# Patient Record
Sex: Female | Born: 1995 | Race: Black or African American | Hispanic: No | Marital: Single | Smoking: Never smoker
Health system: Southern US, Community
[De-identification: ages and names within clinical notes are randomized; demographics above are authoritative.]

---
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
## 2017-05-29 ENCOUNTER — Encounter (HOSPITAL_COMMUNITY): Payer: Self-pay | Admitting: Emergency Medicine

## 2017-05-29 ENCOUNTER — Emergency Department (HOSPITAL_COMMUNITY)
Admission: EM | Admit: 2017-05-29 | Discharge: 2017-05-29 | Disposition: A | Discharge status: Home / Self Care | Payer: No Typology Code available for payment source | Attending: Emergency Medicine | Admitting: Emergency Medicine

## 2017-05-29 DIAGNOSIS — M546 Pain in thoracic spine: Secondary | ICD-10-CM | POA: Insufficient documentation

## 2017-05-29 MED ORDER — METHOCARBAMOL 500 MG PO TABS: 500.0000 mg | ORAL_TABLET | Freq: Two times a day (BID) | ORAL | 0 refills | Status: AC

## 2017-05-29 NOTE — ED Notes (Signed)
Pt statesa she had intermittent pain at arms but none now. Denies any complaint or discomfort.
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
## 2017-05-29 NOTE — ED Notes (Addendum)
Pt states she understands instructions. Home stable with steady gait with family. 
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
## 2020-09-14 ENCOUNTER — Emergency Department (HOSPITAL_COMMUNITY)
Admission: EM | Admit: 2020-09-14 | Discharge: 2020-09-14 | Disposition: A | Discharge status: Home / Self Care | Payer: BC Managed Care – PPO | Attending: Emergency Medicine | Admitting: Emergency Medicine

## 2020-09-14 ENCOUNTER — Encounter (HOSPITAL_COMMUNITY): Payer: Self-pay

## 2020-09-14 ENCOUNTER — Emergency Department (HOSPITAL_COMMUNITY): Payer: BC Managed Care – PPO

## 2020-09-14 ENCOUNTER — Other Ambulatory Visit: Payer: Self-pay

## 2020-09-14 DIAGNOSIS — N12 Tubulo-interstitial nephritis, not specified as acute or chronic: Secondary | ICD-10-CM | POA: Diagnosis not present

## 2020-09-14 DIAGNOSIS — Z20822 Contact with and (suspected) exposure to covid-19: Secondary | ICD-10-CM | POA: Diagnosis not present

## 2020-09-14 DIAGNOSIS — R509 Fever, unspecified: Secondary | ICD-10-CM | POA: Diagnosis present

## 2020-09-14 LAB — COMPREHENSIVE METABOLIC PANEL
ALT: 27 U/L (ref 0–44)
AST: 27 U/L (ref 15–41)
Albumin: 3.9 g/dL (ref 3.5–5.0)
Alkaline Phosphatase: 56 U/L (ref 38–126)
Anion gap: 14 (ref 5–15)
BUN: 7 mg/dL (ref 6–20)
CO2: 24 mmol/L (ref 22–32)
Calcium: 8.9 mg/dL (ref 8.9–10.3)
Chloride: 93 mmol/L — ABNORMAL LOW (ref 98–111)
Creatinine, Ser: 0.82 mg/dL (ref 0.44–1.00)
GFR, Estimated: >60 mL/min (ref >60)
Glucose, Bld: 136 mg/dL — ABNORMAL HIGH (ref 70–99)
Potassium: 3.1 mmol/L — ABNORMAL LOW (ref 3.5–5.1)
Sodium: 131 mmol/L — ABNORMAL LOW (ref 135–145)
Total Bilirubin: 1 mg/dL (ref 0.3–1.2)
Total Protein: 7.6 g/dL (ref 6.5–8.1)

## 2020-09-14 LAB — LACTIC ACID, PLASMA: Lactic Acid, Venous: 1.7 mmol/L (ref 0.5–1.9)

## 2020-09-14 LAB — LIPASE, BLOOD: Lipase: 32 U/L (ref 11–51)

## 2020-09-14 LAB — URINALYSIS, ROUTINE W REFLEX MICROSCOPIC
Bilirubin Urine: NEGATIVE
Glucose, UA: NEGATIVE mg/dL
Ketones, ur: 20 mg/dL — AB
Nitrite: NEGATIVE
Protein, ur: 100 mg/dL — AB
Specific Gravity, Urine: 1.018 (ref 1.005–1.030)
WBC, UA: >50 WBC/hpf — ABNORMAL HIGH (ref 0–5)
pH: 6 (ref 5.0–8.0)

## 2020-09-14 LAB — CBC WITH DIFFERENTIAL/PLATELET
Abs Immature Granulocytes: 0.06 10*3/uL (ref 0.00–0.07)
Basophils Absolute: 0 10*3/uL (ref 0.0–0.1)
Basophils Relative: 0 %
Eosinophils Absolute: 0 10*3/uL (ref 0.0–0.5)
Eosinophils Relative: 0 %
HCT: 37.7 % (ref 36.0–46.0)
Hemoglobin: 12.9 g/dL (ref 12.0–15.0)
Immature Granulocytes: 0 %
Lymphocytes Relative: 3 %
Lymphs Abs: 0.5 10*3/uL — ABNORMAL LOW (ref 0.7–4.0)
MCH: 30.5 pg (ref 26.0–34.0)
MCHC: 34.2 g/dL (ref 30.0–36.0)
MCV: 89.1 fL (ref 80.0–100.0)
Monocytes Absolute: 1.8 10*3/uL — ABNORMAL HIGH (ref 0.1–1.0)
Monocytes Relative: 11 %
Neutro Abs: 13.9 10*3/uL — ABNORMAL HIGH (ref 1.7–7.7)
Neutrophils Relative %: 86 %
Platelets: 206 10*3/uL (ref 150–400)
RBC: 4.23 MIL/uL (ref 3.87–5.11)
RDW: 12.7 % (ref 11.5–15.5)
WBC: 16.3 10*3/uL — ABNORMAL HIGH (ref 4.0–10.5)
nRBC: 0 % (ref 0.0–0.2)

## 2020-09-14 LAB — PREGNANCY, URINE: Preg Test, Ur: NEGATIVE

## 2020-09-14 LAB — RESPIRATORY PANEL BY RT PCR (FLU A&B, COVID)
Influenza A by PCR: NEGATIVE
Influenza B by PCR: NEGATIVE
SARS Coronavirus 2 by RT PCR: NEGATIVE

## 2020-09-14 LAB — I-STAT BETA HCG BLOOD, ED (MC, WL, AP ONLY): I-stat hCG, quantitative: 6.4 m[IU]/mL — ABNORMAL HIGH (ref <5)

## 2020-09-14 MED ORDER — ACETAMINOPHEN 325 MG PO TABS
650.0000 mg | ORAL_TABLET | Freq: Once | ORAL | Status: AC | PRN
  Administered 2020-09-14: 650 mg via ORAL
  Filled 2020-09-14: qty 2

## 2020-09-14 MED ORDER — SODIUM CHLORIDE 0.9 % IV SOLN
1.0000 g | Freq: Once | INTRAVENOUS | Status: AC
  Administered 2020-09-14: 1 g via INTRAVENOUS
  Filled 2020-09-14: qty 10

## 2020-09-14 MED ORDER — KETOROLAC TROMETHAMINE 30 MG/ML IJ SOLN
30.0000 mg | Freq: Once | INTRAMUSCULAR | Status: AC
  Administered 2020-09-14: 30 mg via INTRAVENOUS
  Filled 2020-09-14: qty 1

## 2020-09-14 MED ORDER — SODIUM CHLORIDE 0.9 % IV BOLUS
1000.0000 mL | Freq: Once | INTRAVENOUS | Status: AC
  Administered 2020-09-14: 1000 mL via INTRAVENOUS

## 2020-09-14 MED ORDER — POTASSIUM CHLORIDE 10 MEQ/100ML IV SOLN
10.0000 meq | Freq: Once | INTRAVENOUS | Status: AC
  Administered 2020-09-14: 10 meq via INTRAVENOUS
  Filled 2020-09-14: qty 100

## 2020-09-14 MED ORDER — ONDANSETRON 4 MG PO TBDP: 4.0000 mg | ORAL_TABLET | Freq: Three times a day (TID) | ORAL | 0 refills | Status: AC | PRN

## 2020-09-14 MED ORDER — CEPHALEXIN 500 MG PO CAPS
500.0000 mg | ORAL_CAPSULE | Freq: Once | ORAL | Status: AC
  Administered 2020-09-14: 500 mg via ORAL
  Filled 2020-09-14: qty 1

## 2020-09-14 MED ORDER — FENTANYL CITRATE (PF) 100 MCG/2ML IJ SOLN
50.0000 ug | Freq: Once | INTRAMUSCULAR | Status: AC
  Administered 2020-09-14: 50 ug via INTRAVENOUS
  Filled 2020-09-14: qty 2

## 2020-09-14 MED ORDER — CEPHALEXIN 500 MG PO CAPS: 500.0000 mg | ORAL_CAPSULE | Freq: Four times a day (QID) | ORAL | 0 refills | Status: AC

## 2020-09-14 NOTE — Discharge Instructions (Signed)
As we discussed, your exam today showed evidence of pyelonephritis.  We will plan to treat this with Keflex.  Have also sent you home with Zofran to help with nausea/vomiting.  Make sure you are staying hydrated drink plenty of fluids.  Take Tylenol or ibuprofen for fever relief.  Follow-up with your primary care doctor, call us or the urology group.  If it anytime, you have worsening abdominal pain, persistent vomiting, persistent fever that is not getting better by Tylenol or ibuprofen or you have any worsening or concerning symptoms, he can return to the emergency department.

## 2020-09-14 NOTE — ED Notes (Signed)
Pt in bed talking on phone, call bell in reach

## 2020-09-14 NOTE — ED Triage Notes (Signed)
patient c/o right flank pain, urinary frequency, and fever x 2 days. Patient states she went to an UC yesterday and was prescribed Cipro and Flomax.

## 2020-09-14 NOTE — ED Provider Notes (Signed)
[facility]-EMERGENCY DEPT Provider Note   CSN: [id number] Arrival date & time: 09/14/20  1715     History Chief Complaint  Patient presents with  . Fever  . Flank Pain  . Urinary Frequency    Tina Kelly is a 24 y.o. female who presents for evaluation of fever, lower back pain, urinary frequency x2 days.  She went to urgent care yesterday.  She told that she may have kidney stones but was not told that she had a urinary tract infection.  She was prescribed Cipro and Flomax.  She states that despite those medications, she is feeling the symptoms have been worsened so she came to the emergency department today.  She states that the pain is mostly in the right flank and radiates to the right lower abdomen.  She has had some increased urinary frequency but denies any dysuria or hematuria.  She states that she has not really been able to eat anything.  She has been able to keep liquids down but when she tried to eat, she had some vomiting.  No diarrhea.  She has had fever for last 2 days.  She denies any cough, congestion, chest pain, difficulty breathing.  She has not been vaccinated for Covid.  She has not been exposure to Covid. Her LMP was 2 weeks ago.   The history is provided by the patient.       History reviewed. No pertinent past medical history.  There are no problems to display for this patient.   History reviewed. No pertinent surgical history.   OB History   No obstetric history on file.     Family History  Problem Relation Age of Onset  . Healthy Mother   . Healthy Father     Social History   Tobacco Use  . Smoking status: Never Smoker  . Smokeless tobacco: Never Used  Vaping Use  . Vaping Use: Never used  Substance Use Topics  . Alcohol use: No  . Drug use: No    Home Medications Prior to Admission medications   Medication Sig Start Date End Date Taking? Authorizing Provider  acetaminophen (TYLENOL) 325 MG tablet Take 650 mg by  mouth every 6 (six) hours as needed for mild pain.   Yes [provider]  ciprofloxacin (CIPRO) 500 MG tablet Take 500 mg by mouth 2 (two) times daily. Start date : 09/14/18 09/13/20  Yes [provider]  Cranberry 1000 MG CAPS Take 1,000 mg by mouth daily.   Yes [provider]  tamsulosin (FLOMAX) 0.4 MG CAPS capsule Take 0.4 mg by mouth daily. 09/13/20  Yes [provider]  cephALEXin (KEFLEX) 500 MG capsule Take 1 capsule (500 mg total) by mouth 4 (four) times daily for 7 days. 09/14/20 09/21/20  Maxwell Caul, PA-C  methocarbamol (ROBAXIN) 500 MG tablet Take 1 tablet (500 mg total) by mouth 2 (two) times daily. Patient not taking: Reported on 09/14/2020 05/29/17   Emi Holes, PA-C  ondansetron (ZOFRAN ODT) 4 MG disintegrating tablet Take 1 tablet (4 mg total) by mouth every 8 (eight) hours as needed for nausea or vomiting. 09/14/20   Maxwell Caul, PA-C    Allergies    Patient has no known allergies.  Review of Systems   Review of Systems  Constitutional: Positive for fever.  HENT: Negative for congestion.   Respiratory: Negative for cough and shortness of breath.   Cardiovascular: Negative for chest pain.  Gastrointestinal: Positive for abdominal  pain, nausea and vomiting. Negative for diarrhea.  Genitourinary: Positive for flank pain and frequency. Negative for dysuria and hematuria.  Neurological: Negative for headaches.  All other systems reviewed and are negative.   Physical Exam Updated Vital Signs BP 106/79 (BP Location: Left Arm)   Pulse (!) 101   Temp 99.4 F (37.4 C) (Oral)   Resp 13   Ht 5\' 3"  (1.6 m)   Wt 47.6 kg   LMP 08/31/2020 Comment: negative urine preg test 09/14/20  SpO2 100%   BMI 18.60 kg/m   Physical Exam Vitals and nursing note reviewed.  Constitutional:      Appearance: Normal appearance. She is well-developed.     Comments: Appears uncomfortable, NAD  HENT:     Head: Normocephalic and  atraumatic.  Eyes:     General: Lids are normal.     Conjunctiva/sclera: Conjunctivae normal.     Pupils: Pupils are equal, round, and reactive to light.  Cardiovascular:     Rate and Rhythm: Normal rate and regular rhythm.     Pulses: Normal pulses.     Heart sounds: Normal heart sounds. No murmur heard.  No friction rub. No gallop.   Pulmonary:     Effort: Pulmonary effort is normal.     Breath sounds: Normal breath sounds.     Comments: Lungs clear to auscultation bilaterally.  Symmetric chest rise.  No wheezing, rales, rhonchi. Abdominal:     Palpations: Abdomen is soft. Abdomen is not rigid.     Tenderness: There is abdominal tenderness in the right lower quadrant. There is right CVA tenderness. There is no guarding. Positive signs include McBurney's sign.     Comments: Abdomen is soft, non-distended.  Tenderness palpation noted focally at McBurney's point.  No Murphy sign.  She has right-sided CVA tenderness.  Musculoskeletal:        General: Normal range of motion.     Cervical back: Full passive range of motion without pain.  Skin:    General: Skin is warm and dry.     Capillary Refill: Capillary refill takes less than 2 seconds.  Neurological:     Mental Status: She is alert and oriented to person, place, and time.  Psychiatric:        Speech: Speech normal.     ED Results / Procedures / Treatments   Labs (all labs ordered are listed, but only abnormal results are displayed) Labs Reviewed  URINALYSIS, ROUTINE W REFLEX MICROSCOPIC - Abnormal; Notable for the following components:      Result Value   APPearance HAZY (*)    Hgb urine dipstick SMALL (*)    Ketones, ur 20 (*)    Protein, ur 100 (*)    Leukocytes,Ua SMALL (*)    WBC, UA >50 (*)    Bacteria, UA RARE (*)    Non Squamous Epithelial 0-5 (*)    All other components within normal limits  COMPREHENSIVE METABOLIC PANEL - Abnormal; Notable for the following components:   Sodium 131 (*)    Potassium 3.1 (*)      Chloride 93 (*)    Glucose, Bld 136 (*)    All other components within normal limits  CBC WITH DIFFERENTIAL/PLATELET - Abnormal; Notable for the following components:   WBC 16.3 (*)    Neutro Abs 13.9 (*)    Lymphs Abs 0.5 (*)    Monocytes Absolute 1.8 (*)    All other components within normal limits  I-STAT BETA HCG BLOOD, ED (  [facility], [facility], [facility] ONLY) - Abnormal; Notable for the following components:   I-stat hCG, quantitative 6.4 (*)    All other components within normal limits  RESPIRATORY PANEL BY RT PCR (FLU A&B, COVID)  URINE CULTURE  LIPASE, BLOOD  LACTIC ACID, PLASMA  PREGNANCY, URINE    EKG None  Radiology CT Renal Stone Study  Result Date: 09/14/2020 CLINICAL DATA:  Flank pain urinary frequency and fever EXAM: CT ABDOMEN AND PELVIS WITHOUT CONTRAST TECHNIQUE: Multidetector CT imaging of the abdomen and pelvis was performed following the standard protocol without IV contrast. COMPARISON:  None. FINDINGS: Lower chest: The visualized heart size within normal limits. No pericardial fluid/thickening. No hiatal hernia. The visualized portions of the lungs are clear. Hepatobiliary: Although limited due to the lack of intravenous contrast, normal in appearance without gross focal abnormality. No evidence of calcified gallstones or biliary ductal dilatation. Pancreas:  Unremarkable.  No surrounding inflammatory changes. Spleen: Normal in size. Although limited due to the lack of intravenous contrast, normal in appearance. Adrenals/Urinary Tract: Both adrenal glands appear normal. No renal or collecting system calculi are seen. There is slight enlargement of the lower pole of the right kidney. Perinephric fat stranding changes are seen around the lower pole of the right kidney. The bladder is fluid-filled and unremarkable. Stomach/Bowel: The stomach, small bowel, are normal in appearance. The appendix is unremarkable. There is however inflammatory changes which extend into the right pericolic  gutter around the right colon. No loculated fluid collections or free air. Vascular/Lymphatic: There are no enlarged abdominal or pelvic lymph nodes. No significant gross vascular findings are present. Reproductive: The uterus and adnexa are unremarkable. Other: Small amount of free fluid in the deep cul-de-sac. Musculoskeletal: No acute or significant osseous findings. IMPRESSION: No renal or collecting system calculi. Mild right-sided renal enlargement with perinephric stranding which could be due to pyelonephritis. Inflammatory changes with extend into the right pericolic gutter surrounding the right colon which could be from the perinephric changes or concomitant mild colitis. Small amount of free fluid in the deep pelvis. Electronically Signed   By: Jonna Clark M.D.   On: 09/14/2020 20:09    Procedures Procedures (including critical care time)  Medications Ordered in ED Medications  acetaminophen (TYLENOL) tablet 650 mg (650 mg Oral Given 09/14/20 1740)  sodium chloride 0.9 % bolus 1,000 mL (0 mLs Intravenous Stopped 09/14/20 1942)  sodium chloride 0.9 % bolus 1,000 mL (0 mLs Intravenous Stopped 09/14/20 2154)  fentaNYL (SUBLIMAZE) injection 50 mcg (50 mcg Intravenous Given 09/14/20 1938)  cefTRIAXone (ROCEPHIN) 1 g in sodium chloride 0.9 % 100 mL IVPB (0 g Intravenous Stopped 09/14/20 2026)  potassium chloride 10 mEq in 100 mL IVPB (0 mEq Intravenous Stopped 09/14/20 2154)  ketorolac (TORADOL) 30 MG/ML injection 30 mg (30 mg Intravenous Given 09/14/20 2149)  cephALEXin (KEFLEX) capsule 500 mg (500 mg Oral Given 09/14/20 2149)    ED Course  I have reviewed the triage vital signs and the nursing notes.  Pertinent labs & imaging results that were available during my care of the patient were reviewed by me and considered in my medical decision making (see chart for details).    MDM Rules/Calculators/A&P                          24 year old female who presents for evaluation of 2 days of  fever, lower back pain that radiates into the right lower abdomen as well as some urinary frequency.  No dysuria, hematuria.  Went to urgent care yesterday was told she may have had kidney stone was prescribed Cipro and Flomax. She states that despite these medications, she continued to have symptoms, prompting ED visit.  She states she has not really been had an appetite anything and states that when she tried, she has had some vomiting.  Initially arrival, she is febrile at 103.1, tachycardic.  Vitals otherwise stable.  On exam, she has right-sided CVA tenderness as well as focal tenderness noted McBurney's point.  Consider GU etiology such as UTI and kidney stone versus pyelonephritis.  Though she has not had any dysuria or hematuria so that is lower.  Also consider appendicitis versus other infectious process.  Plan check labs, give antiemetics, fluids.  I-STAT beta is 6.4.  We will add a urine pregnancy.  UA shows small hemoglobin, small leukocytes, pyuria, bacteria.  Lipase is normal.  CMP shows potassium of 3.1.  BUN and creatinine within normal limits.  CBC shows leukocytosis of 16.3.  CT renal study shows no evidence of kidney stone.  There is my mild right-sided renal enlargement with perinephric stranding which could be due to pyelonephritis.  Inflammatory changes which extend to the right pericolic gutter surrounding the right colon which could be from perinephric stranding or concomitant mild colitis.  Small amount of free fluid noted in the pelvis.  Appendix is visualized and is unremarkable.  Uterus and adnexa normal unremarkable.  I discussed results with patient.  Her vitals have improved significantly since being here in the ED and she states that her pain is improved.  She has not any vomiting since being here in the ED.  I discussed with her regarding pyelonephritis.  I discussed with her that given her ongoing symptoms as well as fever, vomiting, that we could admit her for IV antibiotics  and continued IV fluids.  I did offer patient admission.  After discussing extensively with patient, patient would like to try and go home first.  We will plan to p.o. challenge her and if she can tolerate p.o., will discuss about sending her home.  Reevaluation.  Patient able tolerate p.o.  Repeat exam shows improved tenderness noted.  She states she feels better and she would like to try and go home.  We will change her antibiotics to Keflex as well as send her home with some Zofran.  I did once again offered her admission but patient declined.  Patient knows that if any time, her symptoms get worse, she cannot keep anything down and has persistent vomiting or pain gets more severe, she can return the emergency department.  She does have a primary care doctor and instructed her to follow-up.  I also instructed her to follow-up with urology. Patient had ample opportunity for questions and discussion. All patient's questions were answered with full understanding. Strict return precautions discussed. Patient expresses understanding and agreement to plan.   Portions of this note were generated with Scientist, clinical (histocompatibility and immunogenetics). Dictation errors may occur despite best attempts at proofreading.   Final Clinical Impression(s) / ED Diagnoses Final diagnoses:  Pyelonephritis    Rx / DC Orders ED Discharge Orders         Ordered    cephALEXin (KEFLEX) 500 MG capsule  4 times daily        09/14/20 2127    ondansetron (ZOFRAN ODT) 4 MG disintegrating tablet  Every 8 hours PRN        09/14/20 2127  Otilia Kareem Maxwell Caul, PA-C 09/14/20 2205    Bethann BerkshireZammit, Joseph, MD 09/14/20 2216

## 2020-09-18 LAB — URINE CULTURE: Culture: NO GROWTH

## 2021-10-14 IMAGING — CT CT RENAL STONE PROTOCOL
2 of 4 series · 15 of 46 positions shown, 17 images · non-contrast
Comparison: None.

CLINICAL DATA: Flank pain urinary frequency and fever

EXAM:
CT ABDOMEN AND PELVIS WITHOUT CONTRAST
TECHNIQUE: Multidetector CT imaging of the abdomen and pelvis was performed
following the standard protocol without IV contrast.

[Series 2: axial st · axial · 0.62mm/px · z∈[-407,-42]mm · 12 of 83 slices shown, 14 images]
[im 5/83  soft-tissue]
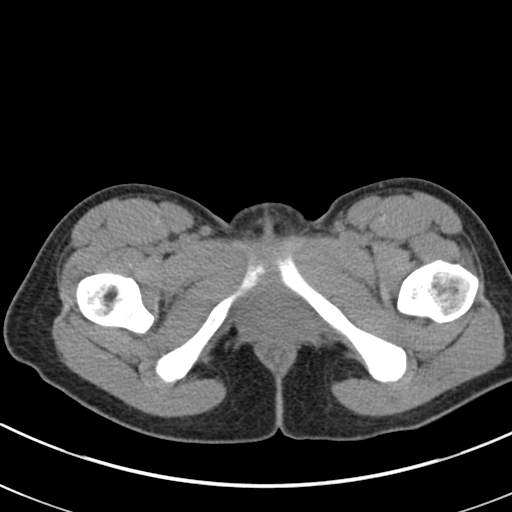
[im 5/83  bone]
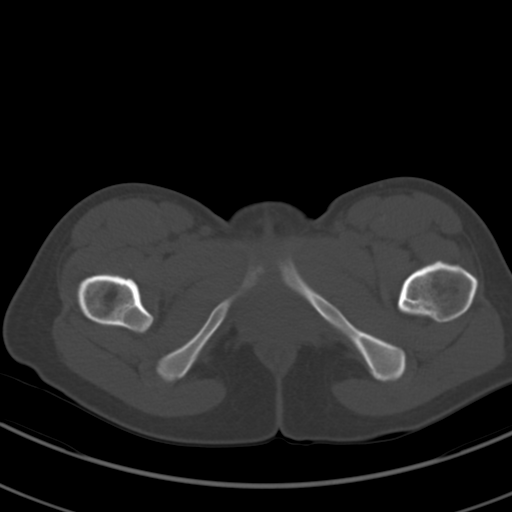
[im 13/83  soft-tissue]
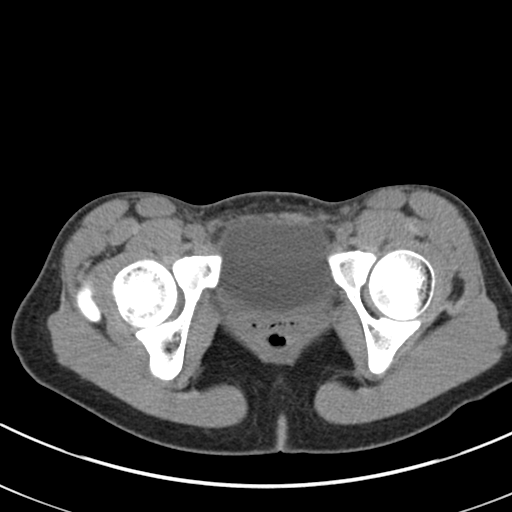
[im 18/83  soft-tissue]
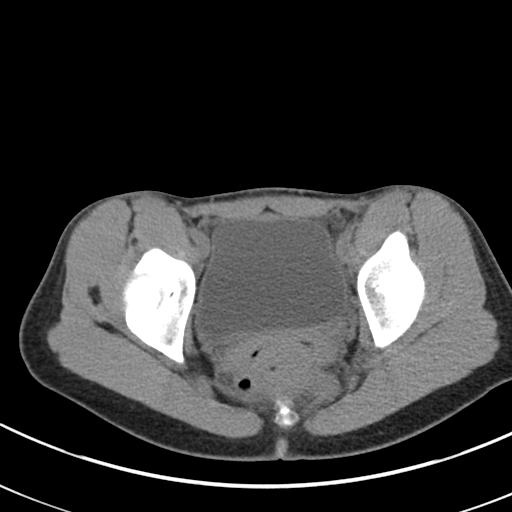
[im 26/83  soft-tissue]
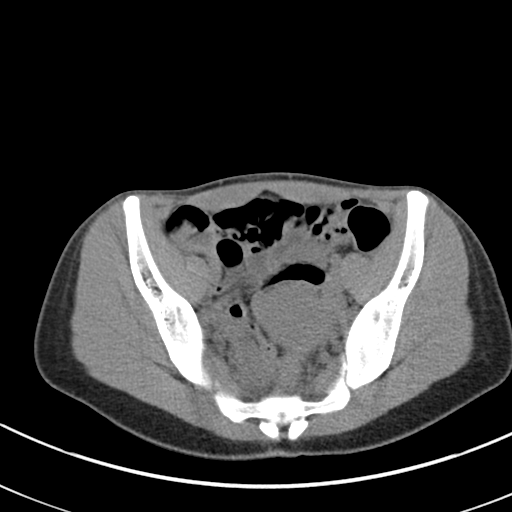
[im 31/83  soft-tissue]
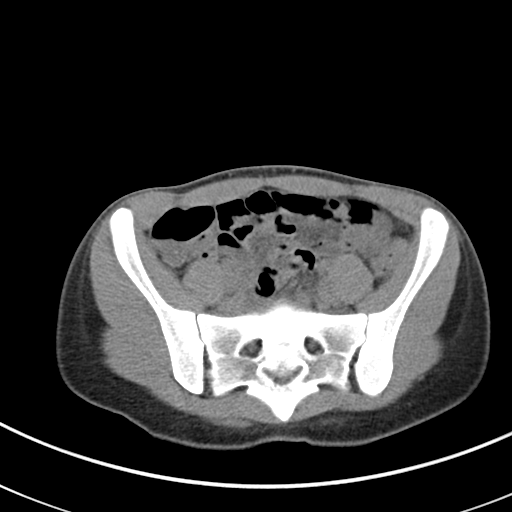
[im 39/83  soft-tissue]
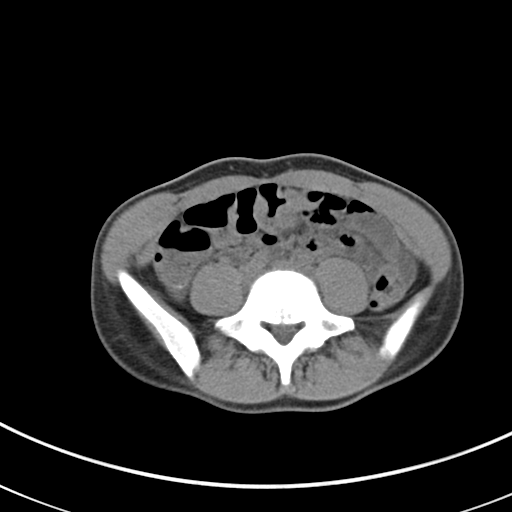
[im 44/83  soft-tissue]
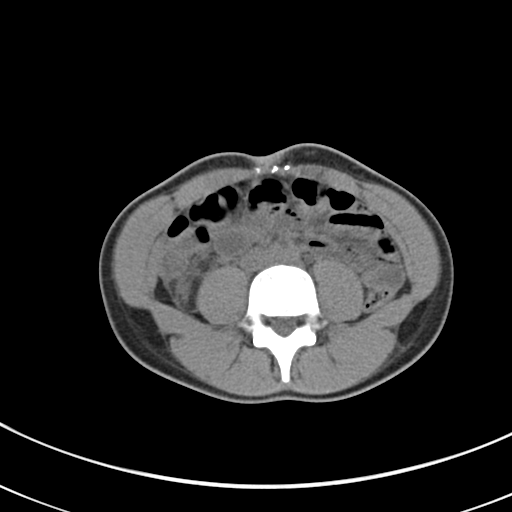
[im 52/83  soft-tissue]
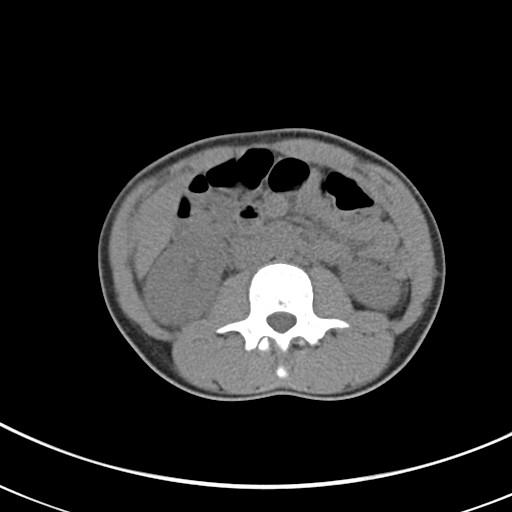
[im 57/83  soft-tissue]
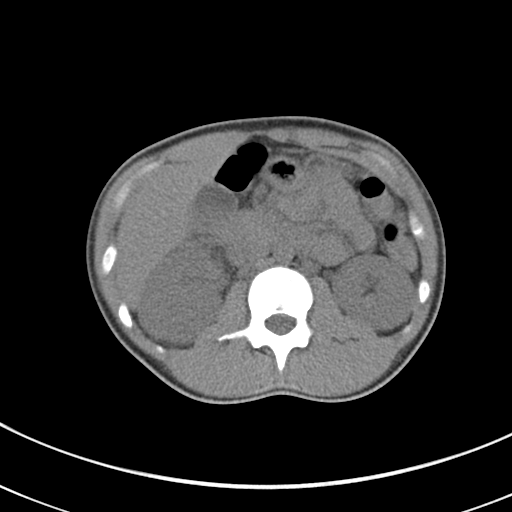
[im 57/83  bone]
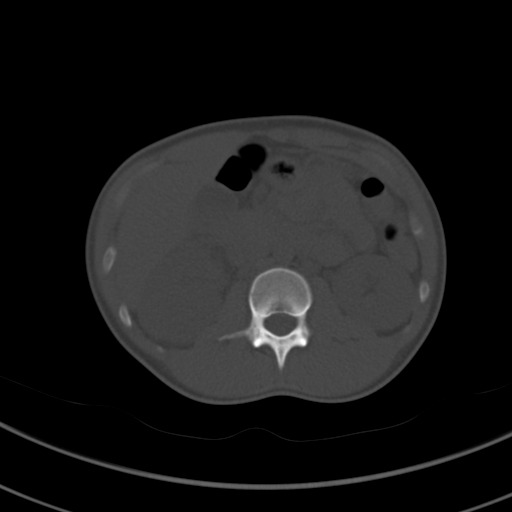
[im 65/83  soft-tissue]
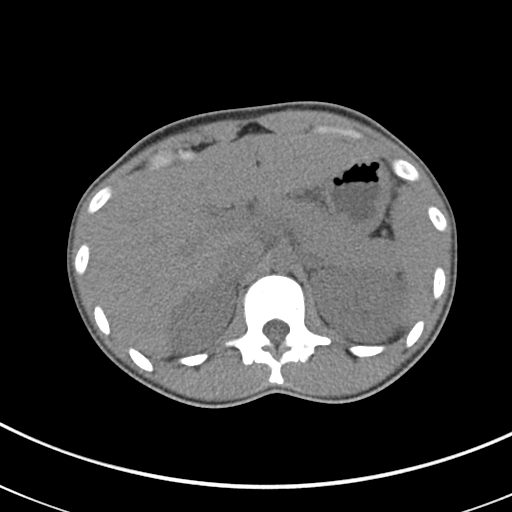
[im 70/83  soft-tissue]
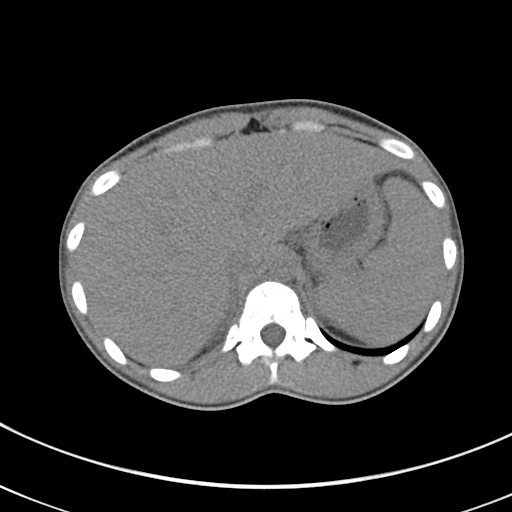
[im 78/83  soft-tissue]
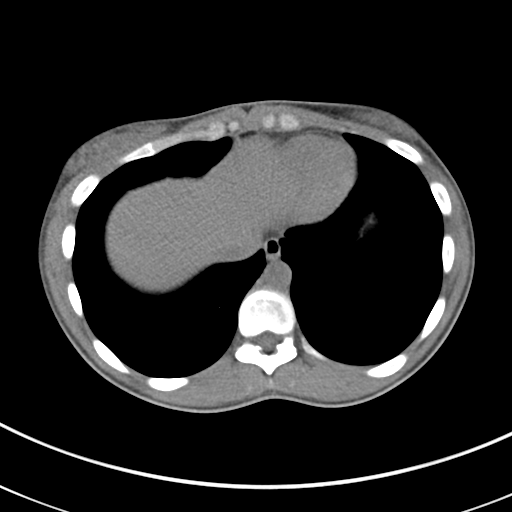

[Series 5: coronal · coronal · 0.64mm/px · 3 of 122 slices shown]
[im 41/122  soft-tissue]
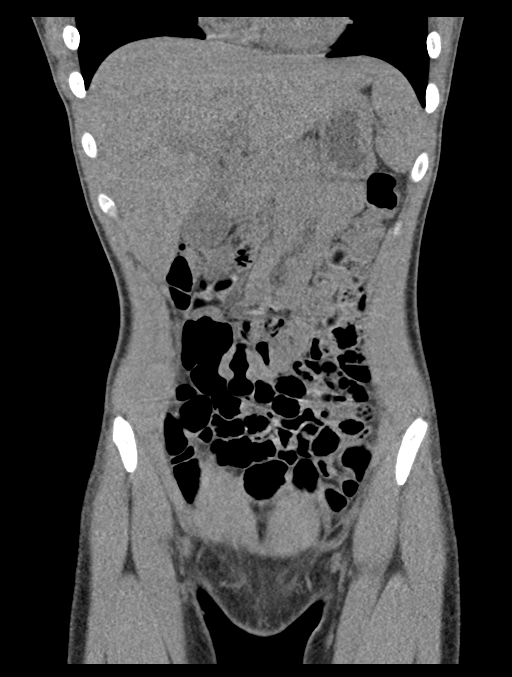
[im 54/122  soft-tissue]
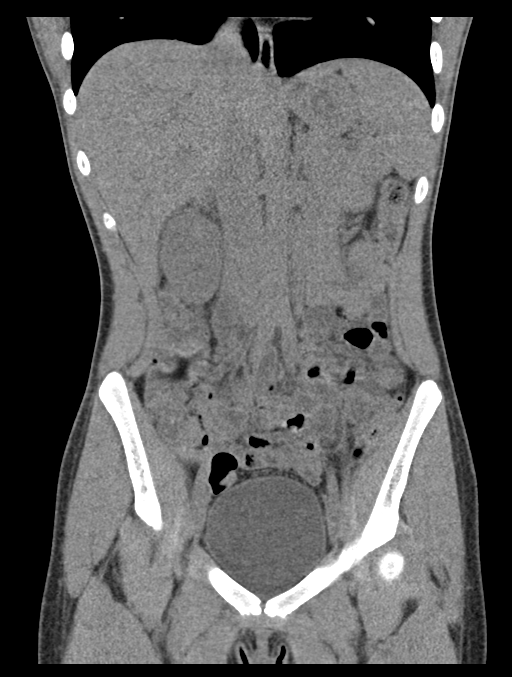
[im 68/122  soft-tissue]
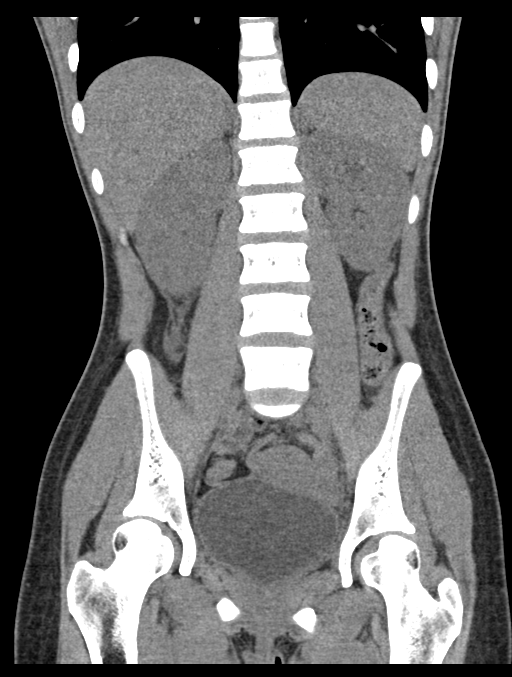

[15 of 46 positions shown; findings below may reference images not displayed]

FINDINGS: Lower chest: The visualized heart size within normal limits. No
pericardial fluid/thickening.

No hiatal hernia.

The visualized portions of the lungs are clear.

Hepatobiliary: Although limited due to the lack of intravenous
contrast, normal in appearance without gross focal abnormality. No
evidence of calcified gallstones or biliary ductal dilatation.

Pancreas:  Unremarkable.  No surrounding inflammatory changes.

Spleen: Normal in size. Although limited due to the lack of
intravenous contrast, normal in appearance.

Adrenals/Urinary Tract: Both adrenal glands appear normal. No renal
or collecting system calculi are seen. There is slight enlargement
of the lower pole of the right kidney. Perinephric fat stranding
changes are seen around the lower pole of the right kidney. The
bladder is fluid-filled and unremarkable.

Stomach/Bowel: The stomach, small bowel, are normal in appearance.
The appendix is unremarkable. There is however inflammatory changes
which extend into the right pericolic gutter around the right colon.
No loculated fluid collections or free air.

Vascular/Lymphatic: There are no enlarged abdominal or pelvic lymph
nodes. No significant gross vascular findings are present.

Reproductive: The uterus and adnexa are unremarkable.

Other: Small amount of free fluid in the deep cul-de-sac.

Musculoskeletal: No acute or significant osseous findings.
IMPRESSION: No renal or collecting system calculi. Mild right-sided renal
enlargement with perinephric stranding which could be due to
pyelonephritis.

Inflammatory changes with extend into the right pericolic gutter
surrounding the right colon which could be from the perinephric
changes or concomitant mild colitis.

Small amount of free fluid in the deep pelvis.
